# Patient Record
Sex: Female | Born: 1990 | Hispanic: Yes | Marital: Single | Smoking: Never smoker
Health system: Southern US, Community
[De-identification: ages and names within clinical notes are randomized; demographics above are authoritative.]

---

## 2016-08-04 ENCOUNTER — Ambulatory Visit (INDEPENDENT_AMBULATORY_CARE_PROVIDER_SITE_OTHER): Payer: Self-pay | Admitting: Family Medicine

## 2016-08-04 VITALS — BP 100/68 | HR 57 | Temp 98.3°F | Resp 18 | Ht 60.0 in | Wt 120.0 lb

## 2016-08-04 DIAGNOSIS — G4489 Other headache syndrome: Secondary | ICD-10-CM

## 2016-08-04 DIAGNOSIS — R112 Nausea with vomiting, unspecified: Secondary | ICD-10-CM

## 2016-08-04 LAB — POCT URINALYSIS DIP (MANUAL ENTRY)
Bilirubin, UA: NEGATIVE
Glucose, UA: NEGATIVE
Ketones, POC UA: NEGATIVE
LEUKOCYTES UA: NEGATIVE
NITRITE UA: NEGATIVE
PH UA: 6.5
PROTEIN UA: NEGATIVE
Spec Grav, UA: 1.01
Urobilinogen, UA: 0.2

## 2016-08-04 LAB — POCT URINE PREGNANCY: PREG TEST UR: NEGATIVE

## 2016-08-04 MED ORDER — NAPROXEN 500 MG PO TABS: 500.0000 mg | ORAL_TABLET | Freq: Two times a day (BID) | ORAL | 0 refills | Status: DC

## 2016-08-04 MED ORDER — PROMETHAZINE HCL 12.5 MG PO TABS: 12.5000 mg | ORAL_TABLET | Freq: Three times a day (TID) | ORAL | 0 refills | Status: DC | PRN

## 2016-08-04 MED ORDER — CYCLOBENZAPRINE HCL 10 MG PO TABS: 10.0000 mg | ORAL_TABLET | Freq: Three times a day (TID) | ORAL | 0 refills | Status: DC | PRN

## 2016-08-04 MED ORDER — TOPIRAMATE 25 MG PO TABS: 50.0000 mg | ORAL_TABLET | Freq: Two times a day (BID) | ORAL | 1 refills | Status: DC

## 2016-08-04 NOTE — Progress Notes (Signed)
[facility]

## 2016-08-04 NOTE — Progress Notes (Addendum)
Patient ID: Sabrina Sims, female    DOB: 21-Feb-1991, 26 y.o.   MRN: [id number]  PCP: No primary care provider on file.  Chief Complaint  Patient presents with  . Emesis  . Headache  . Nausea    Subjective:  HPI [id number] Sabrina Sims Interpreter Service  26 year old female presents for evaluation of nausea, vomiting, and headache. LMPD-07/28/2016. Reports a 2 year hx of headaches that she reports has increased in frequency over the last few months. Most recent headache occurred 2 days ago. Characterizes headache as a throbbing pain, most pronounced behind the eye, most often is unilateral, with most recent headaches occurring within the right ocular region. Headaches are accompanies by nausea, vomiting, facial numbness, and blurry vision. Once headache resolves, symptoms subside. Nausea with vomiting causes improvement of headache. Headache is often improved with vomiting, sleep, and Excedrin. Average duration of headaches ranges from 7-8 hours. Reports during menstrual cycles notices pronounced body aches that resolve after cycle is completed. Reports regular food and fluid intake.   Social History   Social History  . Marital status: Single    Spouse name: N/A  . Number of children: N/A  . Years of education: N/A   Occupational History  . Not on file.   Social History Main Topics  . Smoking status: Never Smoker  . Smokeless tobacco: Never Used  . Alcohol use No  . Drug use: No  . Sexual activity: Not on file   Other Topics Concern  . Not on file   Social History Narrative  . No narrative on file   Review of Systems See HPI  Prior to Admission medications   Medication Sig Start Date End Date Taking? Authorizing Provider  ferrous sulfate 325 (65 FE) MG EC tablet Take 325 mg by mouth 3 (three) times daily with meals.   Yes Historical Provider, MD  Past Medical, Surgical Family and Social History reviewed and updated.  Objective:   Today's Vitals   08/04/16 1010  BP:  100/68  Pulse: (!) 57  Resp: 18  Temp: 98.3 F (36.8 C)  TempSrc: Oral  SpO2: 100%  Weight: 120 lb (54.4 kg)  Height: 5' (1.524 m)    Wt Readings from Last 3 Encounters:  08/04/16 120 lb (54.4 kg)   Physical Exam  Constitutional: She is oriented to person, place, and time. She appears well-developed and well-nourished.  HENT:  Head: Normocephalic and atraumatic.  Right Ear: External ear normal.  Left Ear: External ear normal.  Eyes: Conjunctivae and EOM are normal.  Neck: Normal range of motion. Neck supple.  Cardiovascular: Normal rate, regular rhythm, normal heart sounds and intact distal pulses.   Pulmonary/Chest: Effort normal and breath sounds normal.  Musculoskeletal: Normal range of motion.  Lymphadenopathy:    She has no cervical adenopathy.  Neurological: She is alert and oriented to person, place, and time. No cranial nerve deficit. Coordination normal.  Negative for nystagmus Noted slight sluggish response to light in right pupil. Re-examined and pupils were more appropriately symmetrical in response to light  Skin: Skin is warm and dry.  Psychiatric: She has a normal mood and affect. Her behavior is normal. Judgment and thought content normal.      Assessment & Plan:  1. Migraine headache, with ocular involvement. Initiating preventive tx and patient referred to opthalmology for vision evaluation. -Start Topamax 25 mg once daily for one week then increase to 50 mg once daily for headache prevention - Ambulatory referral to Ophthalmology  2. Nausea and vomiting, intractability of vomiting not specified, unspecified vomiting type - Negative urine pregnancy test. -promethazine 12.5 mg every 8 hours as needed for nausea.  Godfrey Pick. Tiburcio Pea, MSN, FNP-C Primary Care at [facility] [phone]

## 2016-08-04 NOTE — Patient Instructions (Addendum)
Inicie Topamax 25 mg una vez al da International Paperdurante una semana. A continuacin, inicie Topamax 50 mg al da para prevenir el dolor de cabeza. Para dolores de cabeza agudos, tome cyclobenzaprine 10 mg hasta 3 veces al da con Naproxen 500 mg con alimentos. Tome prometazina 12.5 mg cada 8 horas segn sea necesario para las nuseas. Te he recomendado ver una oftalmologa para una evaluacin ptica.   Start Topamax 25 mg once daily for one week.  Then start Topamax 50 mg daily for headache prevention.  For acute headaches, take cyclobenzaprine 10 mg up to 3 times daily with Naproxen 500 mg with food.  Take promethazine 12.5 mg every 8 hours as needed for nausea.  I have referred you to see opthalmology for an optical evaluation.   IF you received an x-ray today, you will receive an invoice from [facility]. Please contact [facility] at [phone] with questions or concerns regarding your invoice.   IF you received labwork today, you will receive an invoice from [facility]. Please contact [facility] at [phone] with questions or concerns regarding your invoice.   Our billing staff will not be able to assist you with questions regarding bills from these companies.  You will be contacted with the lab results as soon as they are available. The fastest way to get your results is to activate your My Chart account. Instructions are located on the last page of this paperwork. If you have not heard from us regarding the results in 2 weeks, please contact this office.     Cefalea migraosa recurrente. (Recurrent Migraine Headache) Una cefalea migraosa es un dolor muy intenso y punzante en uno o ambos lados de la cabeza. Las migraas recurrentes aparecen Neomia Dearuna y Laverda Pageotra vez. Hable con su mdico United Stationerssobre los factores que pueden causar Animal nutritionist(desencadenar) las Soil scientistcefaleas migraosas. CUIDADOS EN EL Nucor CorporationHOGAR  Tome solo los United Parcelmedicamentos segn le haya indicado el mdico.  Cuando tenga la migraa, acustese en  un cuarto oscuro y tranquilo  Lleve un registro diario para averiguar si hay ciertas cosas que le provocan la cefalea migraosa. Por ejemplo, escriba:  Lo que usted come y bebe.  Cunto tiempo duerme.  Algn cambio en su dieta o en los medicamentos.  Beba menos alcohol.  Si fuma, deje de hacerlo.  Duerma lo suficiente.  Disminuya todo tipo de estrs de la vida diaria.  Mantenga las luces tenues si le Goodrich Corporationmolestan las luces brillantes o hacen que la Bishopmigraa empeore. SOLICITE AYUDA SI:  Los medicamentos no Air traffic controllerle calman la migraa.  El dolor regresa.  Tiene fiebre. SOLICITE AYUDA DE INMEDIATO SI:  La migraa empeora.  Presenta rigidez en el cuello.  Tiene dificultad para ver.  Sus msculos estn dbiles, o pierde el control muscular.  Pierde el equilibrio o tiene problemas para Advertising account plannercaminar.  Siente que se desvanece (debilidad) o se desmaya.  Tiene malos sntomas que son diferentes a los primeros sntomas. ASEGRESE DE QUE:  Comprende estas instrucciones.  Controlar su afeccin.  Recibir ayuda de inmediato si no mejora o si empeora. Esta informacin no tiene Theme park managercomo fin reemplazar el consejo del mdico. Asegrese de hacerle al mdico cualquier pregunta que tenga. Document Released: 08/31/2011 Document Revised: 06/13/2013 Document Reviewed: 02/13/2013 Elsevier Interactive Patient Education  2017 [employer]

## 2016-08-05 LAB — URINE CULTURE: Organism ID, Bacteria: NO GROWTH

## 2018-01-25 ENCOUNTER — Other Ambulatory Visit: Payer: Self-pay

## 2018-01-25 ENCOUNTER — Encounter: Payer: Self-pay | Admitting: Family Medicine

## 2018-01-25 ENCOUNTER — Ambulatory Visit (INDEPENDENT_AMBULATORY_CARE_PROVIDER_SITE_OTHER): Payer: Self-pay | Admitting: Family Medicine

## 2018-01-25 VITALS — BP 102/60 | HR 64 | Temp 98.6°F | Ht 60.55 in | Wt 122.6 lb

## 2018-01-25 DIAGNOSIS — R1011 Right upper quadrant pain: Secondary | ICD-10-CM

## 2018-01-25 DIAGNOSIS — J300 Vasomotor rhinitis: Secondary | ICD-10-CM

## 2018-01-25 LAB — COMPREHENSIVE METABOLIC PANEL
ALT: 16 IU/L (ref 0–32)
AST: 22 IU/L (ref 0–40)
Albumin/Globulin Ratio: 1.7 (ref 1.2–2.2)
Albumin: 4.8 g/dL (ref 3.5–5.5)
Alkaline Phosphatase: 63 IU/L (ref 39–117)
BUN/Creatinine Ratio: 14 (ref 9–23)
BUN: 8 mg/dL (ref 6–20)
Bilirubin Total: 0.3 mg/dL (ref 0.0–1.2)
CO2: 26 mmol/L (ref 20–29)
Calcium: 9.6 mg/dL (ref 8.7–10.2)
Chloride: 99 mmol/L (ref 96–106)
Creatinine, Ser: 0.58 mg/dL (ref 0.57–1.00)
GFR calc Af Amer: 144 mL/min/{1.73_m2} (ref >59)
GFR calc non Af Amer: 125 mL/min/{1.73_m2} (ref >59)
Globulin, Total: 2.9 g/dL (ref 1.5–4.5)
Glucose: 91 mg/dL (ref 65–99)
Potassium: 5.1 mmol/L (ref 3.5–5.2)
Sodium: 138 mmol/L (ref 134–144)
Total Protein: 7.7 g/dL (ref 6.0–8.5)

## 2018-01-25 LAB — CBC
Hematocrit: 40.5 % (ref 34.0–46.6)
Hemoglobin: 13.6 g/dL (ref 11.1–15.9)
MCH: 30.8 pg (ref 26.6–33.0)
MCHC: 33.6 g/dL (ref 31.5–35.7)
MCV: 92 fL (ref 79–97)
Platelets: 261 10*3/uL (ref 150–450)
RBC: 4.41 x10E6/uL (ref 3.77–5.28)
RDW: 12.8 % (ref 12.3–15.4)
WBC: 5.3 10*3/uL (ref 3.4–10.8)

## 2018-01-25 MED ORDER — FLUTICASONE PROPIONATE 50 MCG/ACT NA SUSP: 1.0000 | Freq: Two times a day (BID) | NASAL | 6 refills | Status: DC

## 2018-01-25 NOTE — Patient Instructions (Addendum)
IF you received an x-ray today, you will receive an invoice from [facility]. Please contact [facility] at [phone] with questions or concerns regarding your invoice.   IF you received labwork today, you will receive an invoice from [facility]. Please contact [facility] at [phone] with questions or concerns regarding your invoice.   Our billing staff will not be able to assist you with questions regarding bills from these companies.  You will be contacted with the lab results as soon as they are available. The fastest way to get your results is to activate your My Chart account. Instructions are located on the last page of this paperwork. If you have not heard from us regarding the results in 2 weeks, please contact this office.     Clico biliar en los adultos (Biliary Colic, Adult) El clico biliar es un dolor intenso que lo produce un pequeo rgano que se encuentra en la parte superior derecha del vientre (vescula). La vescula almacena un fluido digestivo que se produce en el hgado (bilis) y que permite que el cuerpo degrade la grasa. La bilis y otras enzimas digestivas son transportadas desde el hgado hasta el intestino delgado a travs de estructuras con forma de tubos (conductillos biliares). La vescula y los conductillos biliares forman las vas biliares. A veces se forman depsitos duros de fluidos digestivos en la vescula (clculos biliares) que bloquean el flujo de bilis hacia esta y as se produce un clico biliar. Esta afeccin tambin se conoce como ataque de la vescula biliar. Los clculos biliares pueden ser tan pequeos como un grano de arena o tan grandes como una pelota de golf. Es posible que haya un solo clculo biliar en la vescula o que haya muchos. CAUSAS A menudo la causa del clico biliar son los clculos en la vescula biliar. Con menor frecuencia, un tumor puede bloquear el flujo de bilis desde la vescula y causar un clico  biliar. FACTORES DE RIESGO Es ms probable que esta afeccin se manifieste en:  Mujeres.  Personas con ascendencia hispnica.  Personas con antecedentes familiares de clculos biliares.  Personas obesas.  Personas que [place] and Herz[place]ovinaaumentan o bajan de peso rpidamente.  Personas que tienen una dieta hipercalrica, baja en fibra y rica en carbohidratos refinados, como el pan blanco y el arroz blanco.  Personas que tienen una enfermedad intestinal que afecta la absorcin de nutrientes, como la enfermedad de Crohn.  Personas con enfermedades metablicas, como el sndrome metablico o la diabetes. SNTOMAS El dolor intenso en la zona superior derecha del estmago es el principal sntoma del clico biliar. Es posible que sienta este dolor debajo del pecho pero encima de la cadera. Este dolor ocurre con frecuencia por la noche o despus de comer una comida muy grasosa. El dolor puede empeorar por hasta una hora y durar hasta 12horas. En la International Business Machinesmayora de los casos, el dolor se desvanece (desaparece) en un par de horas. Entre los otros sntomas de esta afeccin, se incluye lo siguiente:  Nuseas y vmitos.  Dolor debajo del hombro derecho. DIAGNSTICO Esta afeccin se diagnostica en funcin de los sntomas, los antecedentes mdicos y un examen fsico. Tambin pueden hacerle exmenes que incluyen lo siguiente:  Anlisis de sangre para descartar una infeccin o inflamacin de los conductillos biliares, de la vescula, del pncreas o del hgado.  Estudios de diagnstico por imgenes, por ejemplo: ? Ecografa. ? Tomografa computarizada (TC). ? Resonancia magntica (RM). En ciertos casos, es posible que le realicen un estudio de diagnstico  por imgenes con una pequea cantidad de material radioactivo (medicina nuclear) para Pharmacist, hospital diagnstico. TRATAMIENTO El tratamiento de esta afeccin puede incluir medicamentos para Acupuncturist dolor o las nuseas. Si tiene clculos biliares que le producen clicos  biliares, es posible que necesite una ciruga para extraer la vescula (colecistectoma). Los clculos biliares tambin se pueden disolver gradualmente con medicamentos. Pueden transcurrir meses o aos antes de que desaparezcan totalmente. INSTRUCCIONES PARA EL CUIDADO EN EL HOGAR  Tome los medicamentos de venta libre y los recetados solamente como se lo haya indicado el mdico.  Beba suficiente lquido para Pharmacologist la orina clara o de color amarillo plido.  Siga las indicaciones del mdico respecto de las restricciones para las comidas o las bebidas. Estas pueden incluir evitar: ? Alimentos grasos y fritos. ? Cualquier comida que intensifique Chief Technology Officer. ? Comer en exceso. ? Ingerir una comida abundante despus de no haber comido por [employer] .  Concurra a todas las visitas de control como se lo haya indicado el mdico. Esto es importante. PREVENCIN Los pasos a seguir para Nutritional therapist afeccin incluyen:  Hydrographic surveyor adecuado.  Practicar actividad fsica con regularidad.  Tener una dieta sana, alta en fibras y baja en grasas.  Limitar la cantidad de azcar y carbohidratos refinados que se consumen, como dulces, harinas blancas y arroz blanco. SOLICITE ATENCIN MDICA SI:  El dolor le dura ms de 5horas.  Vomita.  Siente escalofros o tiene fiebre.  El dolor [place]. SOLICITE ATENCIN MDICA DE INMEDIATO SI:  Tiene un color amarillo en la piel o en la zona blanca del ojo (ictericia).  Tiene la [employer] t y las heces muy claras.  Se siente mareado o se desmaya. Esta informacin no tiene Theme park manager el consejo del mdico. Asegrese de hacerle al mdico cualquier pregunta que tenga. Document Released: 09/15/2007 Document Revised: 10/23/2014 Document Reviewed: 12/23/2015 Elsevier Interactive Patient Education  2018 Arvin[employer].

## 2018-01-25 NOTE — Progress Notes (Signed)
8/6/201910:13 AM  Edison SimonLucia Ventura Machado 03-01-1991, 27 y.o. female [id number]  Chief Complaint  Patient presents with  . Nasal Congestion    since yesterday has had runny nose, took an allergy medication with no resolve    HPI:   Patient is a 27 y.o. female who presents today with couple of concerns  1. Intermittent recurring episodes of awakening at night with constant runny nose, clear. Sometimes sneezing. Took benadryl and did not help. Denies h/o allergies. Reports similar sx in the winter. She reports occ nasal congestion and pressure in ears. Denise itching.denies any ear pain, sinus pain, fever, chills, sore throat, cough or Sob  2. 2 months of ruq pain that radiates to her right upper back after she eats, worse if she eats "heavy". None for the past 2 weeks or so. Pain intense enough that it would maker her splint, feel weak and had to lie down. No fever, chills, nausea or vomiting.   Fall Risk  01/25/2018 08/04/2016  Falls in the past year? No No     [facility] 01/25/2018 08/04/2016  Decreased Interest 0 0  Down, Depressed, Hopeless 0 0  PHQ - 2 Score 0 0    No Known Allergies  Prior to Admission medications   Not on File    History reviewed. No pertinent past medical history.  History reviewed. No pertinent surgical history.  Social History   Tobacco Use  . Smoking status: Never Smoker  . Smokeless tobacco: Never Used  Substance Use Topics  . Alcohol use: No    Family History  Problem Relation Age of Onset  . Healthy Mother   . Healthy Father   . Healthy Sister     ROS Per hpi  OBJECTIVE:  Blood pressure 102/60, pulse 64, temperature 98.6 F (37 C), temperature source Oral, height 5' 0.55" (1.538 m), weight 122 lb 9.6 oz (55.6 kg), last menstrual period 01/07/2018, SpO2 99 %. Body mass index is 23.51 kg/m.   Physical Exam  Constitutional: She is oriented to person, place, and time. She appears well-developed and well-nourished.    HENT:  Head: Normocephalic and atraumatic.  Right Ear: Hearing, tympanic membrane, external ear and ear canal normal.  Left Ear: Hearing, tympanic membrane, external ear and ear canal normal.  Mouth/Throat: Oropharynx is clear and moist.  Eyes: Pupils are equal, round, and reactive to light. EOM are normal.  Neck: Neck supple.  Cardiovascular: Normal rate, regular rhythm and normal heart sounds. Exam reveals no gallop and no friction rub.  No murmur heard. Pulmonary/Chest: Effort normal and breath sounds normal. She has no wheezes. She has no rales.  Abdominal: Soft. Bowel sounds are normal. There is no hepatosplenomegaly. There is tenderness in the right upper quadrant. There is no rebound, no guarding and negative Murphy's sign.  Lymphadenopathy:    She has no cervical adenopathy.  Neurological: She is alert and oriented to person, place, and time.  Skin: Skin is warm and dry.  Nursing note and vitals reviewed.    ASSESSMENT and PLAN  1. Acute vasomotor rhinitis Discussed use of flonase or similar. Supportive measures  2. RUQ pain Concerning for biliary colic. Dietary modifications reviewed. Strict ER precautions given - CBC - Comprehensive metabolic panel - US Abdomen Limited RUQ; Future  Other orders - fluticasone (FLONASE) 50 MCG/ACT nasal spray; Place 1 spray into both nostrils 2 (two) times daily.  Return if symptoms worsen or fail to improve.    Myles LippsIrma M Santiago, MD  Primary Care at [place], [place] [address] Ph.  [phone] Fax [phone]

## 2018-02-15 ENCOUNTER — Ambulatory Visit
Admission: RE | Admit: 2018-02-15 | Discharge: 2018-02-15 | Disposition: A | Discharge status: Home / Self Care | Payer: Self-pay | Source: Ambulatory Visit | Attending: Family Medicine | Admitting: Family Medicine

## 2018-02-15 DIAGNOSIS — R1011 Right upper quadrant pain: Secondary | ICD-10-CM

## 2018-03-02 ENCOUNTER — Telehealth: Payer: Self-pay | Admitting: Family Medicine

## 2018-03-02 NOTE — Telephone Encounter (Signed)
Copied from CRM [id number]. Topic: Quick Communication - Other Results >> Mar 02, 2018  1:19 PM Crist Infante wrote: Pt's friend Irving Shows calling for results of the [place].  Pt does not speak english, and he is on the [facility].

## 2018-03-03 NOTE — Telephone Encounter (Signed)
Results given to friend Voiced understanding

## 2018-03-10 ENCOUNTER — Encounter: Payer: Self-pay | Admitting: *Deleted

## 2019-05-16 IMAGING — US US ABDOMEN LIMITED
1 series · 14 of 25 positions shown · non-contrast
Comparison: None.

CLINICAL DATA: Intermittent right upper quadrant pain
postprandially. The discomfort radiates to the back.

EXAM:
ULTRASOUND ABDOMEN LIMITED RIGHT UPPER QUADRANT

[Series 1: us abdomen limited · 0.17mm/px · 14 of 50 slices shown]
[im 1/50]
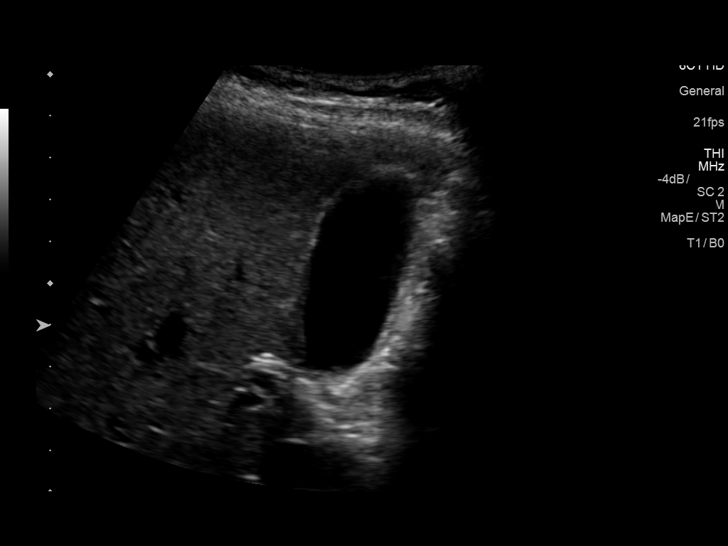
[im 5/50]
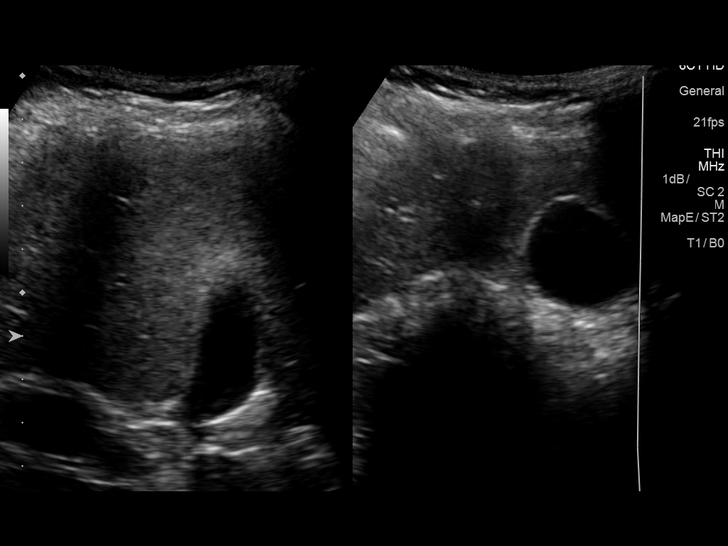
[im 9/50]
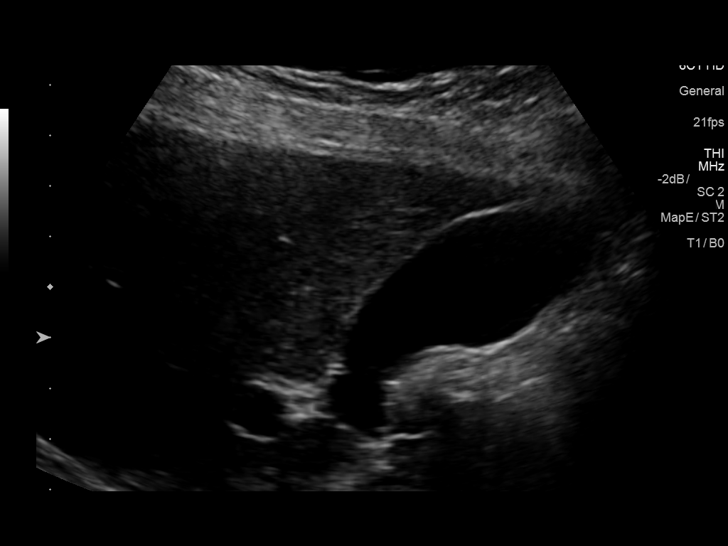
[im 13/50]
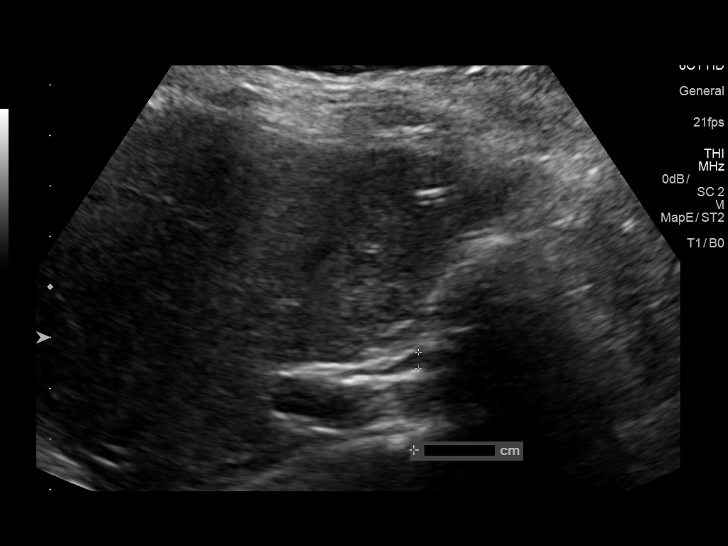
[im 17/50]
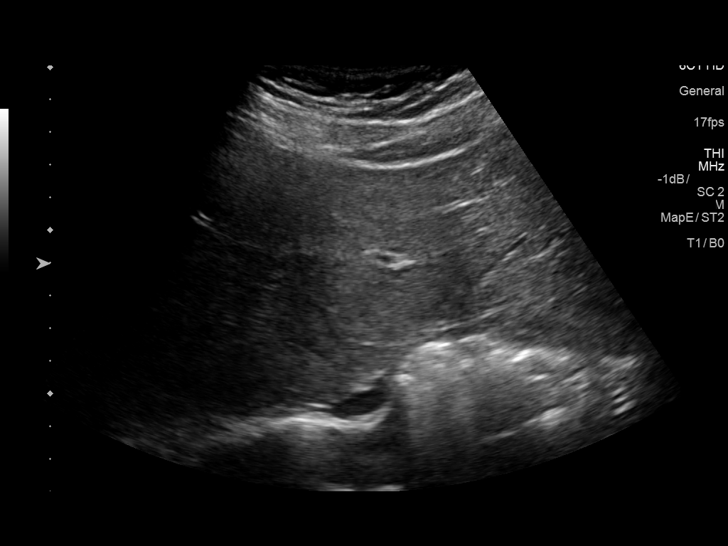
[im 19/50]
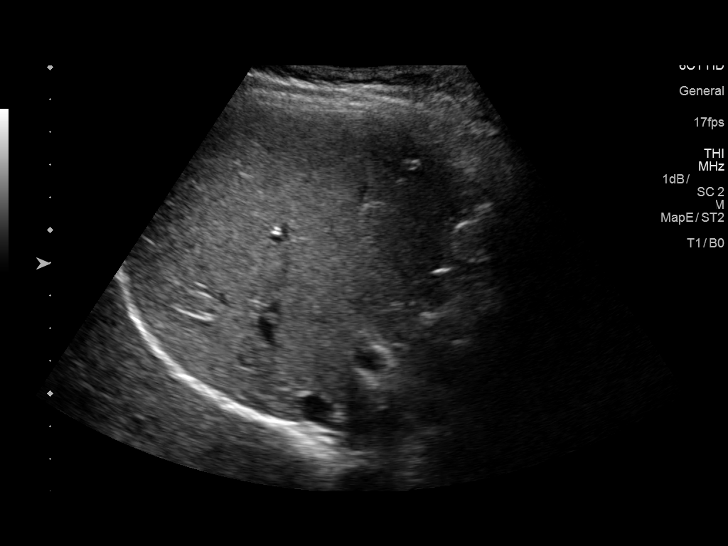
[im 23/50]
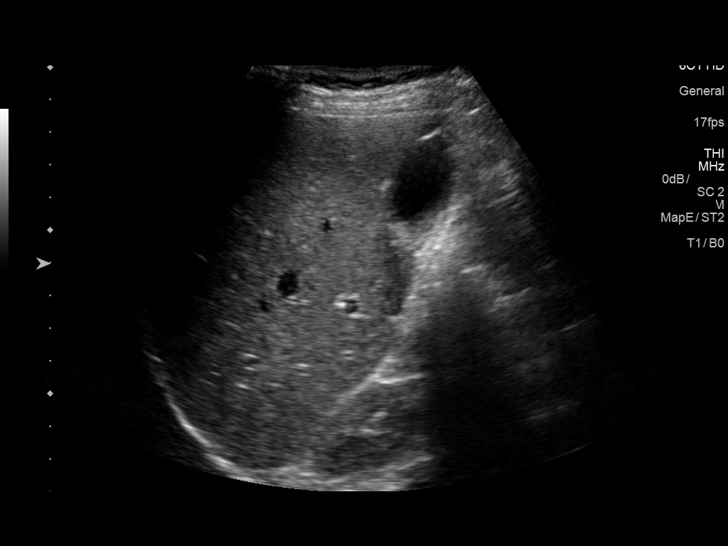
[im 27/50]
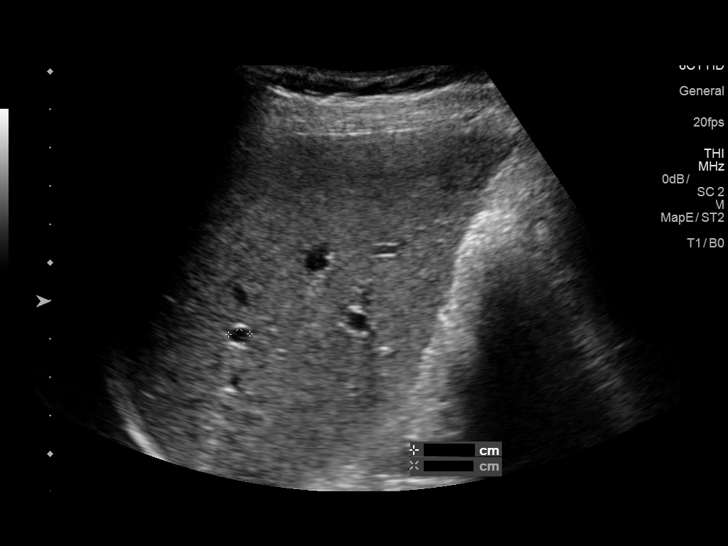
[im 31/50]
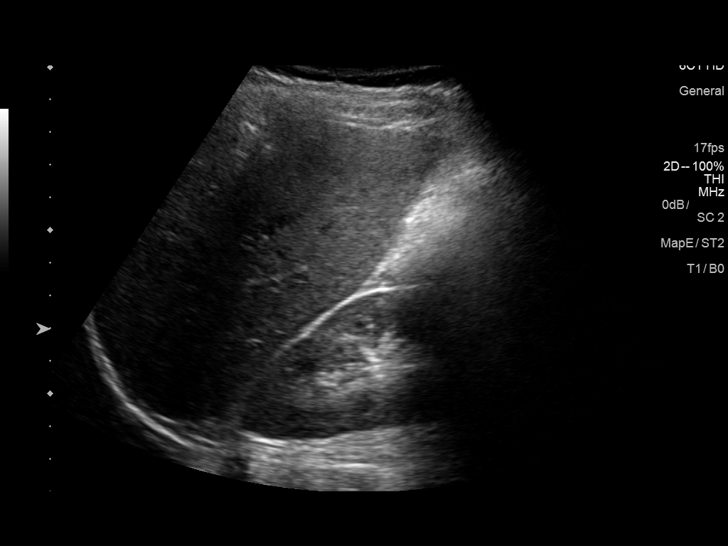
[im 33/50]
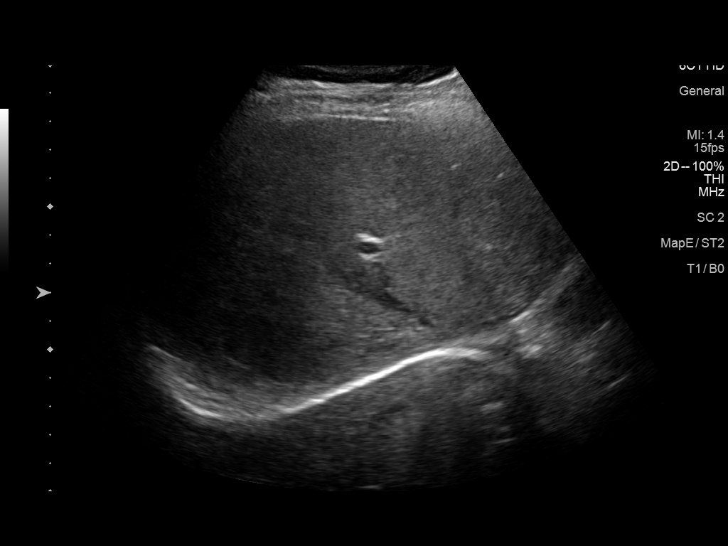
[im 37/50]
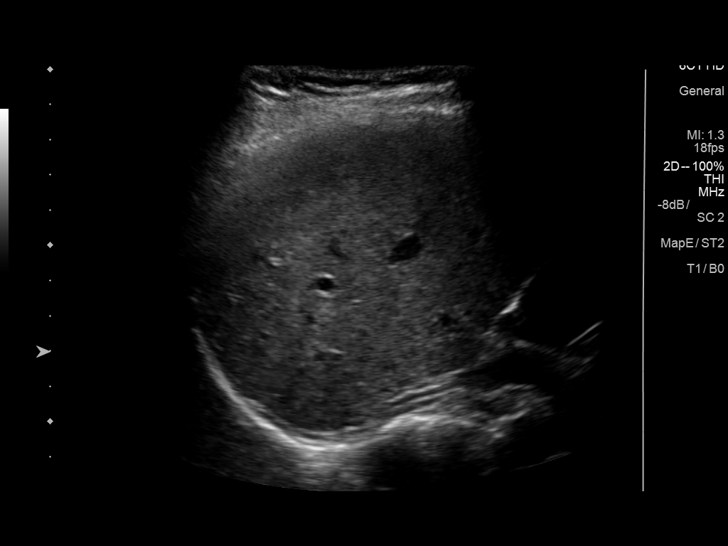
[im 41/50]
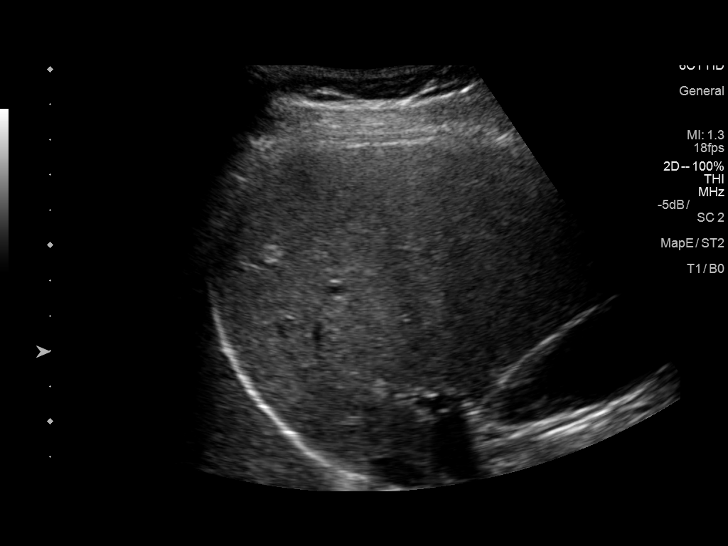
[im 45/50]
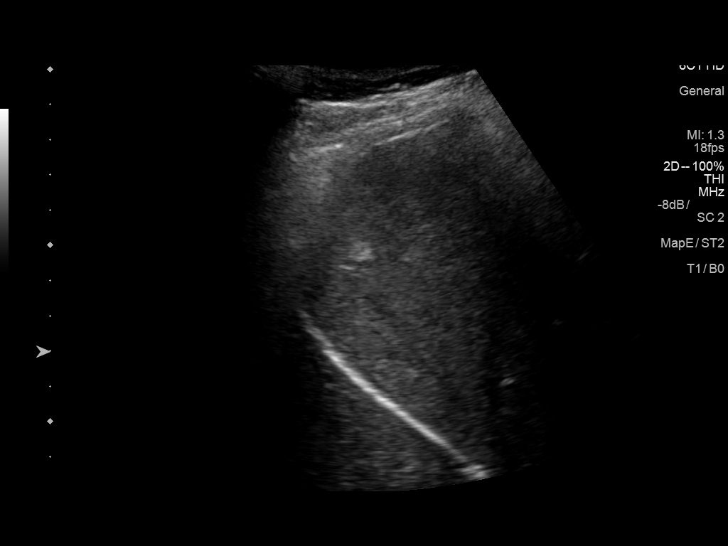
[im 50/50]
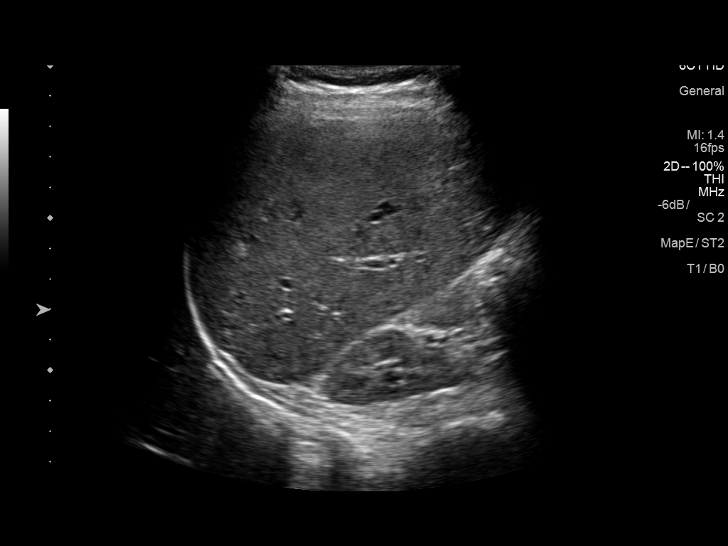

[14 of 25 positions shown; findings below may reference images not displayed]

FINDINGS: Gallbladder:

No gallstones or wall thickening visualized. No sonographic Murphy
sign noted by sonographer.

Common bile duct:

Diameter: 3.2 mm

Liver:

The hepatic echotexture is normal. There is a cyst measuring 6 x 5 x
5 mm in the right hepatic lobe. There is a solid-appearing
hyperechoic focus in the right hepatic lobe measuring 7 x 5 x 7 mm.
There is no intrahepatic ductal dilation. Portal vein is patent on
color Doppler imaging with normal direction of blood flow towards
the liver.
IMPRESSION: No gallstones or sonographic evidence of acute cholecystitis. If
there are strong clinical concerns of chronic gallbladder
dysfunction, a nuclear medicine hepatobiliary scan with gallbladder
ejection fraction determination may be useful.

Probable subcentimeter hemangioma in the right hepatic lobe. Simple
appearing subcentimeter cyst in the right hepatic lobe.

## 2019-07-11 ENCOUNTER — Ambulatory Visit (INDEPENDENT_AMBULATORY_CARE_PROVIDER_SITE_OTHER): Payer: Self-pay | Admitting: Emergency Medicine

## 2019-07-11 ENCOUNTER — Encounter: Payer: Self-pay | Admitting: Emergency Medicine

## 2019-07-11 ENCOUNTER — Other Ambulatory Visit: Payer: Self-pay

## 2019-07-11 VITALS — BP 106/70 | HR 73 | Temp 98.0°F | Resp 16 | Ht 63.5 in | Wt 123.0 lb

## 2019-07-11 DIAGNOSIS — G43109 Migraine with aura, not intractable, without status migrainosus: Secondary | ICD-10-CM

## 2019-07-11 DIAGNOSIS — Z20822 Contact with and (suspected) exposure to covid-19: Secondary | ICD-10-CM

## 2019-07-11 MED ORDER — SUMATRIPTAN SUCCINATE 50 MG PO TABS: ORAL_TABLET | ORAL | 3 refills | Status: DC

## 2019-07-11 NOTE — Progress Notes (Signed)
Sabrina Sims 29 y.o.   Chief Complaint  Patient presents with  . Headache    per patient that covers the RIGHT eye, since Sunday     HISTORY OF PRESENT ILLNESS: This is a 29 y.o. female complaining of migraine headache.  Has a history of migraine headaches for the last 10 years.  Describes them as pulsatile, unilateral, debilitating, lasting anywhere from 10 to 12 hours, associated with nausea and vomiting.  Has been getting 2 or 3/month.  Denies head injuries.  Denies syncope.  Sometimes preceded by photo or phonophobia.  Sometimes she gets visual symptoms.  No other associated symptoms.  Rest and vomiting makes them better. Saw ophthalmologist 1 year ago and told visual symptoms were migraine related. Brother-in-law recently passed from Covid pneumonia.  Requesting blood test due to exposure.  HPI   Prior to Admission medications   Medication Sig Start Date End Date Taking? Authorizing Provider  fluticasone (FLONASE) 50 MCG/ACT nasal spray Place 1 spray into both nostrils 2 (two) times daily. Patient not taking: Reported on 07/11/2019 01/25/18   Myles Lipps, MD    Not on File  There are no problems to display for this patient.   History reviewed. No pertinent past medical history.  History reviewed. No pertinent surgical history.  Social History   Socioeconomic History  . Marital status: Single    Spouse name: Not on file  . Number of children: Not on file  . Years of education: Not on file  . Highest education level: Not on file  Occupational History  . Not on file  Tobacco Use  . Smoking status: Never Smoker  . Smokeless tobacco: Never Used  Substance and Sexual Activity  . Alcohol use: No  . Drug use: No  . Sexual activity: Not on file  Other Topics Concern  . Not on file  Social History Narrative  . Not on file   Social Determinants of Health   Financial Resource Strain:   . Difficulty of Paying Living Expenses: Not on file  Food Insecurity:    . Worried About Programme researcher, broadcasting/film/video in the Last Year: Not on file  . Ran Out of Food in the Last Year: Not on file  Transportation Needs:   . Lack of Transportation (Medical): Not on file  . Lack of Transportation (Non-Medical): Not on file  Physical Activity:   . Days of Exercise per Week: Not on file  . Minutes of Exercise per Session: Not on file  Stress:   . Feeling of Stress : Not on file  Social Connections:   . Frequency of Communication with Friends and Family: Not on file  . Frequency of Social Gatherings with Friends and Family: Not on file  . Attends Religious Services: Not on file  . Active Member of Clubs or Organizations: Not on file  . Attends Banker Meetings: Not on file  . Marital Status: Not on file  Intimate Partner Violence:   . Fear of Current or Ex-Partner: Not on file  . Emotionally Abused: Not on file  . Physically Abused: Not on file  . Sexually Abused: Not on file    Family History  Problem Relation Age of Onset  . Healthy Mother   . Healthy Father   . Healthy Sister      Review of Systems  Constitutional: Negative.  Negative for chills, fever and weight loss.  HENT: Negative.  Negative for congestion, ear pain, hearing loss and sore  throat.   Eyes: Positive for photophobia.  Respiratory: Negative.  Negative for cough and shortness of breath.   Cardiovascular: Negative.  Negative for chest pain and palpitations.  Gastrointestinal: Positive for nausea and vomiting. Negative for abdominal pain, blood in stool and diarrhea.  Genitourinary: Negative.   Musculoskeletal: Negative.  Negative for back pain, myalgias and neck pain.  Skin: Negative.  Negative for rash.  Neurological: Positive for headaches. Negative for dizziness, sensory change, speech change, focal weakness and loss of consciousness.  All other systems reviewed and are negative.  Today's Vitals   07/11/19 1115  BP: 106/70  Pulse: 73  Resp: 16  Temp: 98 F (36.7 C)    TempSrc: Temporal  SpO2: 98%  Weight: 123 lb (55.8 kg)  Height: 5' 3.5" (1.613 m)   Body mass index is 21.45 kg/m.   Physical Exam Vitals reviewed.  Constitutional:      Appearance: She is well-developed.  HENT:     Head: Normocephalic.     Right Ear: Tympanic membrane, ear canal and external ear normal.     Left Ear: Tympanic membrane, ear canal and external ear normal.     Mouth/Throat:     Mouth: Mucous membranes are moist.     Pharynx: Oropharynx is clear.  Eyes:     Extraocular Movements: Extraocular movements intact.     Conjunctiva/sclera: Conjunctivae normal.     Pupils: Pupils are equal, round, and reactive to light.  Cardiovascular:     Rate and Rhythm: Normal rate and regular rhythm.     Pulses: Normal pulses.     Heart sounds: Normal heart sounds.  Pulmonary:     Effort: Pulmonary effort is normal.     Breath sounds: Normal breath sounds.  Abdominal:     General: Bowel sounds are normal. There is no distension.     Palpations: Abdomen is soft. There is no mass.     Tenderness: There is no abdominal tenderness.  Musculoskeletal:        General: Normal range of motion.     Cervical back: Normal range of motion and neck supple.  Skin:    General: Skin is warm and dry.     Capillary Refill: Capillary refill takes less than 2 seconds.  Neurological:     General: No focal deficit present.     Mental Status: She is alert and oriented to person, place, and time.  Psychiatric:        Mood and Affect: Mood normal.        Behavior: Behavior normal.      ASSESSMENT & PLAN: Anesia was seen today for headache.  Diagnoses and all orders for this visit:  Migraine with aura and without status migrainosus, not intractable -     SUMAtriptan (IMITREX) 50 MG tablet; Take 50 mg at the onset of headache; may repeat every 2 hours but no more than 200 mg/24 hours.  Close exposure to COVID-19 virus -     SAR CoV2 Serology (COVID 19)AB(IGG)IA    Patient Instructions        If you have lab work done today you will be contacted with your lab results within the next 2 weeks.  If you have not heard from [place] then please contact us. The fastest way to get your results is to register for My Chart.   IF you received an x-ray today, you will receive an invoice from [facility]. Please contact [facility] at [phone] with questions or concerns  regarding your invoice.   IF you received labwork today, you will receive an invoice from [place]. Please contact [facility] at [phone] with questions or concerns regarding your invoice.   Our billing staff will not be able to assist you with questions regarding bills from these companies.  You will be contacted with the lab results as soon as they are available. The fastest way to get your results is to activate your My Chart account. Instructions are located on the last page of this paperwork. If you have not heard from [place] regarding the results in 2 weeks, please contact this office.     Cefalea migraosa Migraine Headache Una cefalea migraosa es un dolor muy intenso y punzante en uno o ambos lados de la cabeza. Este tipo de dolor de cabeza tambin puede causar otros sntomas. Puede durar desde 4horas hasta 3das. Hable con su mdico sobre las cosas que pueden causar (desencadenar) esta afeccin. Cules son las causas? Se desconoce la causa exacta de esta afeccin. Esta afeccin puede desencadenarse o ser causada por lo siguiente:  Consumo de alcohol.  Consumo de cigarrillos.  Tomar medicamentos como por ejemplo: ? Medicamentos para Engineer, materials torcico (nitroglicerina). ? Anticonceptivos orales. ? Estrgeno. ? Algunos medicamentos para la presin arterial.  Comer o beber ciertos productos.  Hacer actividad fsica. Otros factores que pueden provocar cefalea migraosa son los siguientes:  Tener el perodo menstrual.  Vanetta Mulders.  Hambre.  Estrs.  No dormir lo  suficiente o dormir demasiado.  Cambios climticos.  Cansancio (fatiga). Qu incrementa el riesgo?  Tener entre 25 y 55aos de edad.  Ser mujer.  Tener antecedentes familiares de cefalea migraosa.  Ser de Engineer, manufacturing.  Tener depresin o ansiedad.  Tener mucho sobrepeso. Cules son los signos o los sntomas?  Un dolor punzante. Este dolor puede [employer] siguientes caractersticas: ? Software engineer en cualquier regin de la cabeza, tanto de un lado como de [place]. ? Puede dificultar las actividades cotidianas. ? Puede empeorar con la actividad fsica. ? Puede empeorar con las luces brillantes o los ruidos fuertes.  Otros sntomas pueden incluir: ? Ganas de vomitar (nuseas). ? Vmitos. ? Mareos. ? Sensibilidad a las luces brillantes, los ruidos fuertes o [employer].  Antes de tener una cefalea migraosa, puede recibir seales de advertencia (aura). Un aura puede incluir: ? Ver luces intermitentes o tener puntos ciegos. ? Ver puntos brillantes, halos o lneas en zigzag. ? Tener una visin en tnel o visin borrosa. ? Sentir entumecimiento u hormigueo. ? Tener dificultad para hablar. ? Tener msculos dbiles.  Algunas personas tienen sntomas despus de una cefalea migraosa (fase posdromal), como los siguientes: ? Cansancio. ? Dificultad para pensar (concentrarse). Cmo se trata?  Tomar medicamentos para: ? Engineer, materials. ? Aliviar la sensacin de Programme researcher, broadcasting/film/video. ? Prevenir las Soil scientist.  El tratamiento tambin puede incluir lo siguiente: ? Tomar sesiones de acupuntura. ? Evitar los alimentos que provocan las cefaleas migraosas. ? Aprender Duaine Dredge de controlar las funciones corporales (biorretroalimentacin). ? Terapia para ayudarlo a Solicitor y lidiar con los pensamientos negativos (terapia cognitivo conductual). Siga estas instrucciones en su casa: Medicamentos  [employer] de venta libre y los recetados solamente como se lo  haya indicado el mdico.  Consulte a su mdico si el medicamento que le recetaron: ? Hace que sea necesario que evite conducir o usar maquinaria pesada. ? Puede causarle dificultad para defecar (estreimiento). Es posible que deba tomar estas medidas para prevenir o tratar los problemas para  defecar:  Beber suficiente lquido para mantener el pis (la orina) de color amarillo plido.  Tomar medicamentos recetados o de [employer].  Comer alimentos ricos en fibra. Entre ellos, frijoles, cereales integrales y frutas y verduras frescas.  Limitar los alimentos con alto contenido de grasa y Location manager. Estos incluyen alimentos fritos o dulces. Estilo de vida  No beba alcohol.  No consuma ningn producto que contenga nicotina o tabaco, como cigarrillos, cigarrillos electrnicos y tabaco de Higher education careers adviser. Si necesita ayuda para dejar de fumar, consulte al mdico.  Duerma como mnimo 8horas todas las noches.  Limite el estrs y [place]. Indicaciones generales      Lleve un registro diario para Neurosurgeon lo que Financial trader. Registre, por ejemplo, lo siguiente: ? Lo que usted come y bebe. ? El tiempo que duerme. ? Algn cambio en lo que come o bebe. ? Algn cambio en sus medicamentos.  Si tiene una cefalea migraosa: ? Evite los factores que [employer] sntomas, como las luces brillantes. ? Resulta til acostarse en una habitacin oscura y silenciosa. ? No conduzca vehculos ni opere maquinaria pesada. ? Pregntele al mdico qu actividades son seguras para usted.  Concurra a todas las visitas de seguimiento como se lo haya indicado el mdico. Esto es importante. Comunquese con un mdico si:  Tiene una cefalea migraosa que es diferente o peor que otras que ha tenido.  Tiene ms de [address] de cefalea por mes. Solicite ayuda inmediatamente si:  La cefalea migraosa [employer].  La cefalea migraosa dura ms de 72 horas.  Tiene fiebre.  Presenta rigidez  en el cuello.  Tiene dificultad para ver.  Siente debilidad en los msculos o que no puede controlarlos.  Comienza a perder el equilibrio continuamente.  Comienza a tener dificultad para caminar.  Pierde el conocimiento (se desmaya).  Tiene una convulsin. Resumen  [place] cefalea migraosa es un dolor muy intenso y punzante en uno o ambos lados de la cabeza. Estos dolores de [place] tambin pueden causar otros sntomas.  Esta afeccin puede tratarse con medicamentos y cambios en el estilo de vida.  Lleve un registro diario para Neurosurgeon lo que Financial trader.  Comunquese con un mdico si tiene una cefalea migraosa que es diferente o peor que otras que ha tenido.  Comunquese con el mdico si tiene ms de 15 das de cefalea en un mes. Esta informacin no tiene Marine scientist el consejo del mdico. Asegrese de hacerle al mdico cualquier pregunta que tenga. Document Revised: 08/19/2018 Document Reviewed: 08/19/2018 Elsevier Patient Education  2020 Elsevier Inc.      Agustina Caroli, MD [facility]

## 2019-07-11 NOTE — Patient Instructions (Addendum)
If you have lab work done today you will be contacted with your lab results within the next 2 weeks.  If you have not heard from [place] then please contact us. The fastest way to get your results is to register for My Chart.   IF you received an x-ray today, you will receive an invoice from [facility]. Please contact [facility] at [phone] with questions or concerns regarding your invoice.   IF you received labwork today, you will receive an invoice from [place]. Please contact [facility] at [phone] with questions or concerns regarding your invoice.   Our billing staff will not be able to assist you with questions regarding bills from these companies.  You will be contacted with the lab results as soon as they are available. The fastest way to get your results is to activate your My Chart account. Instructions are located on the last page of this paperwork. If you have not heard from [place] regarding the results in 2 weeks, please contact this office.     Cefalea migraosa Migraine Headache Una cefalea migraosa es un dolor muy intenso y punzante en uno o ambos lados de la cabeza. Este tipo de dolor de cabeza tambin puede causar otros sntomas. Puede durar desde 4horas hasta 3das. Hable con su mdico sobre las cosas que pueden causar (desencadenar) esta afeccin. Cules son las causas? Se desconoce la causa exacta de esta afeccin. Esta afeccin puede desencadenarse o ser causada por lo siguiente:  Consumo de alcohol.  Consumo de cigarrillos.  Tomar medicamentos como por ejemplo: ? Medicamentos para Best boy torcico (nitroglicerina). ? Anticonceptivos orales. ? Estrgeno. ? Algunos medicamentos para la presin arterial.  Comer o beber ciertos productos.  Hacer actividad fsica. Otros factores que pueden provocar cefalea migraosa son los siguientes:  Tener el perodo menstrual.  Glennis Brink.  Hambre.  Estrs.  No dormir lo suficiente o  dormir demasiado.  Cambios climticos.  Cansancio (fatiga). Qu incrementa el riesgo?  Tener entre 25 y 16aos de edad.  Ser mujer.  Tener antecedentes familiares de cefalea migraosa.  Ser de Science writer.  Tener depresin o ansiedad.  Tener mucho sobrepeso. Cules son los signos o los sntomas?  Un dolor punzante. Este dolor puede [employer] siguientes caractersticas: ? Audiological scientist en cualquier regin de la cabeza, tanto de un lado como de [place]. ? Puede dificultar las actividades cotidianas. ? Puede empeorar con la actividad fsica. ? Puede empeorar con las luces brillantes o los ruidos fuertes.  Otros sntomas pueden incluir: ? Ganas de vomitar (nuseas). ? Vmitos. ? Mareos. ? Sensibilidad a las luces brillantes, los ruidos fuertes o [employer].  Antes de tener una cefalea migraosa, puede recibir seales de advertencia (aura). Un aura puede incluir: ? Ver luces intermitentes o tener puntos ciegos. ? Ver puntos brillantes, halos o lneas en zigzag. ? Tener una visin en tnel o visin borrosa. ? Sentir entumecimiento u hormigueo. ? Tener dificultad para hablar. ? Tener msculos dbiles.  Algunas personas tienen sntomas despus de una cefalea migraosa (fase posdromal), como los siguientes: ? Cansancio. ? Dificultad para pensar (concentrarse). Cmo se trata?  Tomar medicamentos para: ? Best boy. ? Aliviar la sensacin de Higher education careers adviser. ? Prevenir las Psychologist, occupational.  El tratamiento tambin puede incluir lo siguiente: ? Tomar sesiones de acupuntura. ? Evitar los alimentos que provocan las cefaleas migraosas. ? Aprender Orland Jarred de controlar las funciones corporales (biorretroalimentacin). ? Terapia para ayudarlo a Civil engineer, contracting y lidiar con los pensamientos  negativos (terapia cognitivo conductual). Siga estas instrucciones en su casa: Medicamentos  Tome los medicamentos de venta libre y los recetados solamente como se lo haya  indicado el mdico.  Consulte a su mdico si el medicamento que le recetaron: ? Hace que sea necesario que evite conducir o usar maquinaria pesada. ? Puede causarle dificultad para defecar (estreimiento). Es posible que deba tomar estas medidas para prevenir o tratar los problemas para defecar:  Beber suficiente lquido para mantener el pis (la orina) de color amarillo plido.  Tomar medicamentos recetados o de venta libre.  Comer alimentos ricos en fibra. Entre ellos, frijoles, cereales integrales y frutas y verduras frescas.  Limitar los alimentos con alto contenido de grasa y azcar. Estos incluyen alimentos fritos o dulces. Estilo de vida  No beba alcohol.  No consuma ningn producto que contenga nicotina o tabaco, como cigarrillos, cigarrillos electrnicos y tabaco de mascar. Si necesita ayuda para dejar de fumar, consulte al mdico.  Duerma como mnimo 8horas todas las noches.  Limite el estrs y [place]jelo. Indicaciones generales      Lleve un registro diario para averiguar lo que puede provocar las cefaleas migraosas. Registre, por ejemplo, lo siguiente: ? Lo que usted come y bebe. ? El tiempo que duerme. ? Algn cambio en lo que come o bebe. ? Algn cambio en sus medicamentos.  Si tiene una cefalea migraosa: ? Evite los factores que empeoren los sntomas, como las luces brillantes. ? Resulta til acostarse en una habitacin oscura y silenciosa. ? No conduzca vehculos ni opere maquinaria pesada. ? Pregntele al mdico qu actividades son seguras para usted.  Concurra a todas las visitas de seguimiento como se lo haya indicado el mdico. Esto es importante. Comunquese con un mdico si:  Tiene una cefalea migraosa que es diferente o peor que otras que ha tenido.  Tiene ms de 15 das de cefalea por mes. Solicite ayuda inmediatamente si:  La cefalea migraosa empeora mucho.  La cefalea migraosa dura ms de 72 horas.  Tiene fiebre.  Presenta rigidez en  el cuello.  Tiene dificultad para ver.  Siente debilidad en los msculos o que no puede controlarlos.  Comienza a perder el equilibrio continuamente.  Comienza a tener dificultad para caminar.  Pierde el conocimiento (se desmaya).  Tiene una convulsin. Resumen  Una cefalea migraosa es un dolor muy intenso y punzante en uno o ambos lados de la cabeza. Estos dolores de cabeza tambin pueden causar otros sntomas.  Esta afeccin puede tratarse con medicamentos y cambios en el estilo de vida.  Lleve un registro diario para averiguar lo que puede provocar las cefaleas migraosas.  Comunquese con un mdico si tiene una cefalea migraosa que es diferente o peor que otras que ha tenido.  Comunquese con el mdico si tiene ms de 15 das de cefalea en un mes. Esta informacin no tiene como fin reemplazar el consejo del mdico. Asegrese de hacerle al mdico cualquier pregunta que tenga. Document Revised: 08/19/2018 Document Reviewed: 08/19/2018 Elsevier Patient Education  2020 Elsevier Inc.  

## 2019-07-12 ENCOUNTER — Encounter: Payer: Self-pay | Admitting: Radiology

## 2019-07-12 LAB — SAR COV2 SEROLOGY (COVID19)AB(IGG),IA: DiaSorin SARS-CoV-2 Ab, IgG: NEGATIVE

## 2019-10-03 ENCOUNTER — Ambulatory Visit (INDEPENDENT_AMBULATORY_CARE_PROVIDER_SITE_OTHER): Payer: Self-pay | Admitting: Registered Nurse

## 2019-10-03 ENCOUNTER — Other Ambulatory Visit: Payer: Self-pay

## 2019-10-03 ENCOUNTER — Encounter: Payer: Self-pay | Admitting: Registered Nurse

## 2019-10-03 VITALS — BP 108/74 | HR 71 | Temp 97.9°F | Ht 63.5 in | Wt 124.0 lb

## 2019-10-03 DIAGNOSIS — Z789 Other specified health status: Secondary | ICD-10-CM

## 2019-10-03 DIAGNOSIS — G43109 Migraine with aura, not intractable, without status migrainosus: Secondary | ICD-10-CM

## 2019-10-03 MED ORDER — SUMATRIPTAN SUCCINATE 50 MG PO TABS: 50.0000 mg | ORAL_TABLET | ORAL | 4 refills | Status: AC | PRN

## 2019-10-03 NOTE — Progress Notes (Signed)
Acute Office Visit  Subjective:    Patient ID: Sabrina Sims, female    DOB: 1990-10-18, 29 y.o.   MRN: [id number]  Chief Complaint  Patient presents with  . Mass    bump discovered on the left shoulder. Says the bump appeared 8 days ago agter 2nd covid vaccine. Does have some pain.     HPI Patient is in today for swelling on L clavicle.  Started in the day or two after getting her second covid shot. Received this shot 8 days ago. Has steadily improved since, but concerned that there is something deeper going on.   Noted after shot felt a little feverish and sore for 1-2 days, resolved quickly.  No breast abnormalities noted.  History reviewed. No pertinent past medical history.  History reviewed. No pertinent surgical history.  Family History  Problem Relation Age of Onset  . Healthy Mother   . Healthy Father   . Healthy Sister     Social History   Socioeconomic History  . Marital status: Single    Spouse name: Not on file  . Number of children: Not on file  . Years of education: Not on file  . Highest education level: Not on file  Occupational History  . Not on file  Tobacco Use  . Smoking status: Never Smoker  . Smokeless tobacco: Never Used  Substance and Sexual Activity  . Alcohol use: No  . Drug use: No  . Sexual activity: Yes  Other Topics Concern  . Not on file  Social History Narrative  . Not on file   Social Determinants of Health   Financial Resource Strain:   . Difficulty of Paying Living Expenses:   Food Insecurity:   . Worried About Charity fundraiser in the Last Year:   . Arboriculturist in the Last Year:   Transportation Needs:   . Film/video editor (Medical):   Marland Kitchen Lack of Transportation (Non-Medical):   Physical Activity:   . Days of Exercise per Week:   . Minutes of Exercise per Session:   Stress:   . Feeling of Stress :   Social Connections:   . Frequency of Communication with Friends and Family:   . Frequency of  Social Gatherings with Friends and Family:   . Attends Religious Services:   . Active Member of Clubs or Organizations:   . Attends Archivist Meetings:   Marland Kitchen Marital Status:   Intimate Partner Violence:   . Fear of Current or Ex-Partner:   . Emotionally Abused:   Marland Kitchen Physically Abused:   . Sexually Abused:     Outpatient Medications Prior to Visit  Medication Sig Dispense Refill  . fluticasone (FLONASE) 50 MCG/ACT nasal spray Place 1 spray into both nostrils 2 (two) times daily. (Patient not taking: Reported on 07/11/2019) 16 g 6  . SUMAtriptan (IMITREX) 50 MG tablet Take 50 mg at the onset of headache; may repeat every 2 hours but no more than 200 mg/24 hours. 10 tablet 3   No facility-administered medications prior to visit.    Not on File  Review of Systems  Constitutional: Negative.   HENT: Negative.   Eyes: Negative.   Respiratory: Negative.   Cardiovascular: Negative.   Gastrointestinal: Negative.   Endocrine: Negative.   Genitourinary: Negative.   Musculoskeletal: Negative.   Skin: Negative.   Allergic/Immunologic: Negative.   Neurological: Negative.   Hematological: Negative.   Psychiatric/Behavioral: Negative.   All other systems reviewed  and are negative.      Objective:    Physical Exam Vitals and nursing note reviewed.  Constitutional:      General: She is not in acute distress.    Appearance: Normal appearance. She is normal weight. She is not ill-appearing, toxic-appearing or diaphoretic.     Comments: Supraclavicular lymphadenopathy suspected with mild tenderness and point tenderness  Cardiovascular:     Rate and Rhythm: Normal rate and regular rhythm.  Skin:    General: Skin is warm and dry.     Capillary Refill: Capillary refill takes less than 2 seconds.     Coloration: Skin is not jaundiced or pale.     Findings: No bruising, erythema, lesion or rash.  Neurological:     General: No focal deficit present.     Mental Status: She is  alert and oriented to person, place, and time. Mental status is at baseline.  Psychiatric:        Mood and Affect: Mood normal.        Behavior: Behavior normal.        Thought Content: Thought content normal.        Judgment: Judgment normal.     BP 108/74   Pulse 71   Temp 97.9 F (36.6 C)   Ht 5' 3.5" (1.613 m)   Wt 124 lb (56.2 kg)   LMP 09/21/2019   SpO2 98%   BMI 21.62 kg/m  Wt Readings from Last 3 Encounters:  10/03/19 124 lb (56.2 kg)  07/11/19 123 lb (55.8 kg)  01/25/18 122 lb 9.6 oz (55.6 kg)    Health Maintenance Due  Topic Date Due  . HIV Screening  Never done  . PAP SMEAR-Modifier  Never done    There are no preventive care reminders to display for this patient.   No results found for: TSH Lab Results  Component Value Date   WBC 5.3 01/25/2018   HGB 13.6 01/25/2018   HCT 40.5 01/25/2018   MCV 92 01/25/2018   PLT 261 01/25/2018   Lab Results  Component Value Date   NA 138 01/25/2018   K 5.1 01/25/2018   CO2 26 01/25/2018   GLUCOSE 91 01/25/2018   BUN 8 01/25/2018   CREATININE 0.58 01/25/2018   BILITOT 0.3 01/25/2018   ALKPHOS 63 01/25/2018   AST 22 01/25/2018   ALT 16 01/25/2018   PROT 7.7 01/25/2018   ALBUMIN 4.8 01/25/2018   CALCIUM 9.6 01/25/2018   No results found for: CHOL No results found for: HDL No results found for: LDLCALC No results found for: TRIG No results found for: CHOLHDL No results found for: ENMM7W     Assessment & Plan:   Problem List Items Addressed This Visit    None    Visit Diagnoses    Has immunity to COVID-19 virus    -  Primary   Migraine with aura and without status migrainosus, not intractable       Relevant Medications   SUMAtriptan (IMITREX) 50 MG tablet       Meds ordered this encounter  Medications  . SUMAtriptan (IMITREX) 50 MG tablet    Sig: Take 1 tablet (50 mg total) by mouth every 2 (two) hours as needed for migraine. May repeat in 2 hours if headache persists or recurs.    Dispense:   10 tablet    Refill:  4    Order Specific Question:   Supervising Provider    Answer:   Creta Levin, ZOE  A [[id number]]   PLAN  Suspect local reaction with supraclavicular lymphadenopathy. Given steady improvement, suggest patient monitors and returns if any new or different symptoms arise.  Needs refills on sumatriptan for migraines - no complaints with this medication, it works well, uses relatively infrequently. No new or red flag symptoms on headaches.  Patient encouraged to call clinic with any questions, comments, or concerns.   [name], NP

## 2019-10-03 NOTE — Patient Instructions (Signed)
° ° ° °  If you have lab work done today you will be contacted with your lab results within the next 2 weeks.  If you have not heard from us then please contact us. The fastest way to get your results is to register for My Chart. ° ° °IF you received an x-ray today, you will receive an invoice from [facility]. Please contact  Radiology at [phone] with questions or concerns regarding your invoice.  ° °IF you received labwork today, you will receive an invoice from [facility]. Please contact [facility] at [phone] with questions or concerns regarding your invoice.  ° °Our billing staff will not be able to assist you with questions regarding bills from these companies. ° °You will be contacted with the lab results as soon as they are available. The fastest way to get your results is to activate your My Chart account. Instructions are located on the last page of this paperwork. If you have not heard from us regarding the results in 2 weeks, please contact this office. °  ° ° ° °

## 2022-12-30 ENCOUNTER — Encounter: Payer: Self-pay | Admitting: Emergency Medicine

## 2022-12-30 ENCOUNTER — Ambulatory Visit (INDEPENDENT_AMBULATORY_CARE_PROVIDER_SITE_OTHER): Payer: Self-pay | Admitting: Emergency Medicine

## 2022-12-30 VITALS — BP 118/74 | HR 62 | Temp 99.0°F | Ht 63.5 in | Wt 133.5 lb

## 2022-12-30 DIAGNOSIS — Z7689 Persons encountering health services in other specified circumstances: Secondary | ICD-10-CM

## 2022-12-30 DIAGNOSIS — R1010 Upper abdominal pain, unspecified: Secondary | ICD-10-CM

## 2022-12-30 LAB — CBC WITH DIFFERENTIAL/PLATELET
Basophils Absolute: 0 10*3/uL (ref 0.0–0.1)
Basophils Relative: 0.4 % (ref 0.0–3.0)
Eosinophils Absolute: 0.2 10*3/uL (ref 0.0–0.7)
Eosinophils Relative: 2.6 % (ref 0.0–5.0)
HCT: 38.1 % (ref 36.0–46.0)
Hemoglobin: 12.9 g/dL (ref 12.0–15.0)
Lymphocytes Relative: 36.7 % (ref 12.0–46.0)
Lymphs Abs: 2.3 10*3/uL (ref 0.7–4.0)
MCHC: 33.7 g/dL (ref 30.0–36.0)
MCV: 90.5 fl (ref 78.0–100.0)
Monocytes Absolute: 0.5 10*3/uL (ref 0.1–1.0)
Monocytes Relative: 8.5 % (ref 3.0–12.0)
Neutro Abs: 3.2 10*3/uL (ref 1.4–7.7)
Neutrophils Relative %: 51.8 % (ref 43.0–77.0)
Platelets: 280 10*3/uL (ref 150.0–400.0)
RBC: 4.21 Mil/uL (ref 3.87–5.11)
RDW: 12.9 % (ref 11.5–15.5)
WBC: 6.1 10*3/uL (ref 4.0–10.5)

## 2022-12-30 LAB — COMPREHENSIVE METABOLIC PANEL
ALT: 20 U/L (ref 0–35)
AST: 18 U/L (ref 0–37)
Albumin: 4.3 g/dL (ref 3.5–5.2)
Alkaline Phosphatase: 54 U/L (ref 39–117)
BUN: 10 mg/dL (ref 6–23)
CO2: 28 mEq/L (ref 19–32)
Calcium: 9.7 mg/dL (ref 8.4–10.5)
Chloride: 101 mEq/L (ref 96–112)
Creatinine, Ser: 0.59 mg/dL (ref 0.40–1.20)
GFR: 117.56 mL/min (ref >60.00)
Glucose, Bld: 89 mg/dL (ref 70–99)
Potassium: 4.2 mEq/L (ref 3.5–5.1)
Sodium: 136 mEq/L (ref 135–145)
Total Bilirubin: 0.3 mg/dL (ref 0.2–1.2)
Total Protein: 7.5 g/dL (ref 6.0–8.3)

## 2022-12-30 LAB — LIPASE: Lipase: 43 U/L (ref 11.0–59.0)

## 2022-12-30 LAB — LIPID PANEL
Cholesterol: 204 mg/dL — ABNORMAL HIGH (ref 0–200)
HDL: 55.8 mg/dL (ref >39.00)
LDL Cholesterol: 115 mg/dL — ABNORMAL HIGH (ref 0–99)
NonHDL: 147.8
Total CHOL/HDL Ratio: 4
Triglycerides: 164 mg/dL — ABNORMAL HIGH (ref 0.0–149.0)
VLDL: 32.8 mg/dL (ref 0.0–40.0)

## 2022-12-30 LAB — HEMOGLOBIN A1C: Hgb A1c MFr Bld: 5.6 % (ref 4.6–6.5)

## 2022-12-30 NOTE — Progress Notes (Signed)
Sabrina Sims 32 y.o.   Chief Complaint  Patient presents with   New Patient (Initial Visit)    Patient states she his having some upper abd pain, radiates around to her back, after she eats certain foods she feels the pain.  she had an u/s done     HISTORY OF PRESENT ILLNESS: This is a 32 y.o. female first visit to this office, here to establish care with me. Mostly complaining of upper abdominal discomfort triggered by certain foods mostly fried foods Went to urgent care clinic last April.  Normal CMP and CBC.  Abdominal ultrasound showed small cyst to liver.  No gallstones. Normal kidneys.  Normal aorta.  Normal pancreas. No other complaints or medical concerns today  HPI   Prior to Admission medications   Medication Sig Start Date End Date Taking? Authorizing Provider  SUMAtriptan (IMITREX) 50 MG tablet Take 1 tablet (50 mg total) by mouth every 2 (two) hours as needed for migraine. May repeat in 2 hours if headache persists or recurs. Patient not taking: Reported on 12/30/2022 10/03/19   [name], NP    Not on File  There are no problems to display for this patient.   No past medical history on file.  No past surgical history on file.  Social History   Socioeconomic History   Marital status: Single    Spouse name: Not on file   Number of children: Not on file   Years of education: Not on file   Highest education level: Not on file  Occupational History   Not on file  Tobacco Use   Smoking status: Never   Smokeless tobacco: Never  Substance and Sexual Activity   Alcohol use: No   Drug use: No   Sexual activity: Yes  Other Topics Concern   Not on file  Social History Narrative   Not on file   Social Determinants of Health   Financial Resource Strain: Not on file  Food Insecurity: Not on file  Transportation Needs: Not on file  Physical Activity: Not on file  Stress: Not on file  Social Connections: Not on file  Intimate Partner  Violence: Not on file    Family History  Problem Relation Age of Onset   Healthy Mother    Healthy Father    Healthy Sister      Review of Systems  Constitutional: Negative.  Negative for chills and fever.  HENT: Negative.  Negative for congestion and sore throat.   Respiratory: Negative.  Negative for cough and shortness of breath.   Cardiovascular: Negative.  Negative for chest pain and palpitations.  Gastrointestinal:  Positive for abdominal pain. Negative for blood in stool, constipation, diarrhea, melena, nausea and vomiting.  Genitourinary: Negative.  Negative for dysuria and hematuria.  Skin: Negative.  Negative for rash.  Neurological: Negative.  Negative for dizziness and headaches.  All other systems reviewed and are negative.   Vitals:   12/30/22 1401  BP: 118/74  Pulse: 62  Temp: 99 F (37.2 C)  SpO2: 98%    Physical Exam Vitals reviewed.  Constitutional:      Appearance: Normal appearance.  HENT:     Head: Normocephalic.     Mouth/Throat:     Mouth: Mucous membranes are moist.     Pharynx: Oropharynx is clear.  Eyes:     Extraocular Movements: Extraocular movements intact.     Conjunctiva/sclera: Conjunctivae normal.     Pupils: Pupils are equal, round, and reactive to  light.  Cardiovascular:     Rate and Rhythm: Normal rate and regular rhythm.     Pulses: Normal pulses.     Heart sounds: Normal heart sounds.  Pulmonary:     Effort: Pulmonary effort is normal.     Breath sounds: Normal breath sounds.  Abdominal:     Palpations: Abdomen is soft.     Tenderness: There is no abdominal tenderness.  Musculoskeletal:     Cervical back: No tenderness.  Lymphadenopathy:     Cervical: No cervical adenopathy.  Skin:    General: Skin is warm and dry.     Capillary Refill: Capillary refill takes less than 2 seconds.  Neurological:     General: No focal deficit present.     Mental Status: She is alert and oriented to person, place, and time.   Psychiatric:        Mood and Affect: Mood normal.        Behavior: Behavior normal.      ASSESSMENT & PLAN: Problem List Items Addressed This Visit       Other   Upper abdominal pain - Primary    Clinically stable.  No red flag signs or symptoms. Benign examination. Differential diagnosis discussed. Recent abdominal ultrasound report reviewed with patient CMP and CBC done last April within normal limits Most likely developing nonspecific food intolerance. May need GI evaluation Blood work done today.      Relevant Orders   Lipase   Hemoglobin A1c   Comprehensive metabolic panel   CBC with Differential/Platelet   Lipid panel   Other Visit Diagnoses     Encounter to establish care          Patient Instructions  Mantenimiento de la salud en las mujeres Health Maintenance, Female Adoptar un estilo de vida saludable y recibir atencin preventiva son importantes para promover la salud y Counsellor. Consulte al mdico sobre: El esquema adecuado para hacerse pruebas y exmenes peridicos. Cosas que puede hacer por su cuenta para prevenir enfermedades y [place] sano. Qu debo saber sobre la dieta, el peso y el ejercicio? Consuma una dieta saludable  Consuma una dieta que incluya muchas verduras, frutas, productos lcteos con bajo contenido de [place] y Associate Professor. No consuma muchos alimentos ricos en grasas slidas, azcares agregados o sodio. Mantenga un peso saludable El ndice de masa muscular [facility]) se [place] para identificar problemas de [place]. Proporciona una estimacin de la grasa corporal basndose en el peso y la altura. Su mdico puede ayudarle a Engineer, site IMC y a Personnel officer o Pharmacologist un peso saludable. Haga ejercicio con regularidad Haga ejercicio con regularidad. Esta es una de las prcticas ms importantes que puede hacer por su salud. La [employer] de los adultos deben seguir estas pautas: Education officer, environmental, al menos, 150 minutos de actividad fsica por semana.  El ejercicio debe aumentar la frecuencia cardaca y Media planner transpirar (ejercicio de intensidad moderada). Hacer ejercicios de fortalecimiento por lo [employer] por semana. Agregue esto a su plan de ejercicio de intensidad moderada. Pase menos tiempo sentada. Incluso la actividad fsica ligera puede ser beneficiosa. Controle sus niveles de colesterol y lpidos en la sangre Comience a realizarse anlisis de lpidos y Oncologist en la sangre a los 20 aos y luego reptalos cada 5 aos. Hgase controlar los niveles de colesterol con mayor frecuencia si: Sus niveles de lpidos y colesterol son altos. Es mayor de 40 aos. Presenta un alto riesgo de padecer enfermedades cardacas. Qu debo saber [employer]  de deteccin del cncer? Segn su historia clnica y sus antecedentes familiares, es posible que deba realizarse pruebas de deteccin del cncer en diferentes edades. Esto puede incluir pruebas de deteccin de lo siguiente: Cncer de mama. Cncer de cuello uterino. Cncer colorrectal. Cncer de piel. Cncer de pulmn. Qu debo saber sobre la enfermedad cardaca, la diabetes y la hipertensin arterial? Presin arterial y enfermedad cardaca La hipertensin arterial causa enfermedades cardacas y [place] el riesgo de accidente cerebrovascular. Es ms probable que esto se manifieste en las personas que tienen lecturas de presin arterial alta o tienen sobrepeso. Hgase controlar la presin arterial: Cada 3 a 5 aos si tiene entre 18 y 60 aos. Todos los aos si es mayor de 40 aos. Diabetes Realcese exmenes de deteccin de la diabetes con regularidad. Este anlisis revisa el nivel de azcar en la sangre en [place]. Hgase las pruebas de deteccin: Cada tres aos despus de los 40 aos de edad si tiene un peso normal y un bajo riesgo de padecer diabetes. Con ms frecuencia y a partir de [place] edad inferior si tiene sobrepeso o un alto riesgo de padecer diabetes. Qu debo saber sobre  la prevencin de infecciones? Hepatitis B Si tiene un riesgo ms alto de contraer hepatitis B, debe someterse a un examen de deteccin de este virus. Hable con el mdico para averiguar si tiene riesgo de contraer la infeccin por hepatitis B. Hepatitis C Se recomienda el anlisis a: [employer] 1945 y 1965. Todas las personas que tengan un riesgo de haber contrado hepatitis C. Enfermedades de transmisin sexual (ETS) Hgase las pruebas de Airline pilot de ITS, incluidas la gonorrea y la clamidia, si: Es sexualmente activa y es menor de [address]. Es mayor de [address], y Public affairs consultant informa que corre riesgo de tener este tipo de infecciones. La actividad sexual ha cambiado desde que le hicieron la ltima prueba de deteccin y tiene un riesgo mayor de Warehouse manager clamidia o Copy. Pregntele al mdico si usted tiene riesgo. Pregntele al mdico si usted tiene un alto riesgo de Primary school teacher VIH. El mdico tambin puede recomendarle un medicamento recetado para ayudar a evitar la infeccin por el VIH. Si elige tomar medicamentos para prevenir el VIH, primero debe [employer] de deteccin del VIH. Luego debe hacerse anlisis cada 3 meses mientras est tomando los medicamentos. Embarazo Si est por dejar de Armed forces training and education officer (fase premenopusica) y usted puede quedar [place], busque asesoramiento antes de [place]. [place] de 400 a 800 microgramos (mcg) de cido [employer] si [place]. Pida mtodos de control de la natalidad (anticonceptivos) si desea evitar un embarazo no deseado. Osteoporosis y [place] La osteoporosis es una enfermedad en la que los huesos pierden los minerales y la fuerza por el avance de la edad. El resultado pueden ser fracturas en los [place]. Si tiene 65 aos o ms, o si est en riesgo de sufrir osteoporosis y fracturas, pregunte a su mdico si debe: Hacerse pruebas de deteccin de prdida sea. Tomar un suplemento de calcio o de vitamina D  para reducir el riesgo de fracturas. Recibir terapia de reemplazo hormonal (TRH) para tratar los sntomas de la menopausia. Siga estas indicaciones en su casa: Consumo de alcohol No beba alcohol si: Su mdico le indica no hacerlo. Est embarazada, puede estar embarazada o est tratando de [place]. Si bebe alcohol: Limite la cantidad que bebe a lo siguiente: De 0 a 1 bebida por da. Sepa cunta cantidad de alcohol  hay en las bebidas que toma. En los [address], una medida equivale a una botella de cerveza de 12 oz (355 ml), un vaso de vino de 5 oz (148 ml) o un vaso de una bebida alcohlica de alta graduacin de 1 oz (44 ml). Estilo de vida No consuma ningn producto que contenga nicotina o tabaco. Estos productos incluyen cigarrillos, tabaco para Theatre manager y aparatos de vapeo, como los Administrator, Civil Service. Si necesita ayuda para dejar de consumir estos productos, consulte al mdico. No consuma drogas. No comparta agujas. Solicite ayuda a su mdico si necesita apoyo o informacin para abandonar las drogas. Indicaciones generales Realcese los estudios de rutina de [address], dentales y de Wellsite geologist. Mantngase al da con las vacunas. Infrmele a su mdico si: Se siente deprimida con frecuencia. Alguna vez ha sido vctima de [place] o no se siente seguro en su casa. Resumen Adoptar un estilo de vida saludable y recibir atencin preventiva son importantes para promover la salud y Counsellor. Siga las instrucciones del mdico acerca de una dieta saludable, el ejercicio y la realizacin de pruebas o exmenes para Hotel manager. Siga las instrucciones del mdico con respecto al control del colesterol y la presin arterial. Esta informacin no tiene Theme park manager el consejo del mdico. Asegrese de hacerle al mdico cualquier pregunta que tenga. Document Revised: 11/14/2020 Document Reviewed: 11/14/2020 Elsevier Patient Education  2024 Elsevier Inc.    Edwina Barth, MD [facility]

## 2022-12-30 NOTE — Assessment & Plan Note (Signed)
Clinically stable.  No red flag signs or symptoms. Benign examination. Differential diagnosis discussed. Recent abdominal ultrasound report reviewed with patient CMP and CBC done last April within normal limits Most likely developing nonspecific food intolerance. May need GI evaluation Blood work done today.

## 2022-12-30 NOTE — Patient Instructions (Signed)

## 2023-01-25 ENCOUNTER — Ambulatory Visit (INDEPENDENT_AMBULATORY_CARE_PROVIDER_SITE_OTHER): Payer: Self-pay | Admitting: Emergency Medicine

## 2023-01-25 VITALS — BP 120/84 | HR 90 | Temp 98.6°F | Ht 63.5 in | Wt 133.2 lb

## 2023-01-25 DIAGNOSIS — L509 Urticaria, unspecified: Secondary | ICD-10-CM

## 2023-01-25 MED ORDER — METHYLPREDNISOLONE ACETATE 40 MG/ML IJ SUSP
40.0000 mg | Freq: Once | INTRAMUSCULAR | Status: AC
Start: 2023-01-25 — End: 2023-01-25
  Administered 2023-01-25: 40 mg via INTRAMUSCULAR

## 2023-01-25 MED ORDER — METHYLPREDNISOLONE 4 MG PO TBPK
ORAL_TABLET | ORAL | 1 refills | Status: AC
Start: 2023-01-25 — End: ?

## 2023-01-25 NOTE — Progress Notes (Signed)
Sabrina Sims 32 y.o.   Chief Complaint  Patient presents with   Rash    Patient has a rash on her legs, started on her back and scalp, red and itchy     HISTORY OF PRESENT ILLNESS: This is a 32 y.o. female complaining of diffuse itchy rash in her body that started last Saturday Took Benadryl with partial relief.  Still symptomatic  Rash Associated symptoms include coughing. Pertinent negatives include no congestion, diarrhea, fever, shortness of breath, sore throat or vomiting.     Prior to Admission medications   Medication Sig Start Date End Date Taking? Authorizing Provider  methylPREDNISolone (MEDROL DOSEPAK) 4 MG TBPK tablet Sig as indicated 01/25/23  Yes Liam Cammarata, Eilleen Kempf, MD  SUMAtriptan (IMITREX) 50 MG tablet Take 1 tablet (50 mg total) by mouth every 2 (two) hours as needed for migraine. May repeat in 2 hours if headache persists or recurs. Patient not taking: Reported on 12/30/2022 10/03/19   [name], NP    Allergies  Allergen Reactions   Naproxen Sodium Hives and Itching    There are no problems to display for this patient.   No past medical history on file.  No past surgical history on file.  Social History   Socioeconomic History   Marital status: Single    Spouse name: Not on file   Number of children: Not on file   Years of education: Not on file   Highest education level: 12th grade  Occupational History   Not on file  Tobacco Use   Smoking status: Never   Smokeless tobacco: Never  Substance and Sexual Activity   Alcohol use: No   Drug use: No   Sexual activity: Yes  Other Topics Concern   Not on file  Social History Narrative   Not on file   Social Determinants of Health   Financial Resource Strain: Low Risk  (01/25/2023)   Overall Financial Resource Strain (CARDIA)    Difficulty of Paying Living Expenses: Not very hard  Food Insecurity: Patient Declined (01/25/2023)   Hunger Vital Sign    Worried About Running Out of  Food in the Last Year: Patient declined    Ran Out of Food in the Last Year: Patient declined  Transportation Needs: No Transportation Needs (01/25/2023)   PRAPARE - Administrator, Civil Service (Medical): No    Lack of Transportation (Non-Medical): No  Physical Activity: Insufficiently Active (01/25/2023)   Exercise Vital Sign    Days of Exercise per Week: 1 day    Minutes of Exercise per Session: 30 min  Stress: No Stress Concern Present (01/25/2023)   [employer] of Occupational Health - Occupational Stress Questionnaire    Feeling of Stress : Not at all  Social Connections: Unknown (01/25/2023)   Social Connection and Isolation Panel [NHANES]    Frequency of Communication with Friends and Family: Once a week    Frequency of Social Gatherings with Friends and Family: Patient declined    Attends Religious Services: 1 to 4 times per year    Active Member of [employer] or Organizations: No    Attends Banker Meetings: Not on file    Marital Status: Living with partner  Intimate Partner Violence: Unknown (10/02/2022)   Received from [employer], [facility]   HITS    Physically Hurt: Not on file    Insult or Talk Down To: Not on file    Threaten Physical Harm: Not on file  Scream or Curse: Not on file    Family History  Problem Relation Age of Onset   Healthy Mother    Healthy Father    Healthy Sister      Review of Systems  Constitutional: Negative.  Negative for chills and fever.  HENT:  Negative for congestion and sore throat.   Respiratory:  Positive for cough. Negative for shortness of breath and wheezing.   Cardiovascular: Negative.  Negative for chest pain and palpitations.  Gastrointestinal:  Negative for abdominal pain, diarrhea, nausea and vomiting.  Genitourinary: Negative.  Negative for dysuria and hematuria.  Skin:  Positive for itching and rash.  Neurological: Negative.  Negative for dizziness and headaches.  All other systems  reviewed and are negative.   Vitals:   01/25/23 1045  BP: 120/84  Pulse: 90  Temp: 98.6 F (37 C)  SpO2: 100%    Physical Exam Vitals reviewed.  Constitutional:      Appearance: Normal appearance.  HENT:     Head: Normocephalic.     Mouth/Throat:     Mouth: Mucous membranes are moist.     Pharynx: Oropharynx is clear.  Eyes:     Extraocular Movements: Extraocular movements intact.     Conjunctiva/sclera: Conjunctivae normal.     Pupils: Pupils are equal, round, and reactive to light.  Cardiovascular:     Rate and Rhythm: Normal rate and regular rhythm.     Pulses: Normal pulses.     Heart sounds: Normal heart sounds.  Pulmonary:     Effort: Pulmonary effort is normal.     Breath sounds: Normal breath sounds.  Musculoskeletal:     Cervical back: No tenderness.  Lymphadenopathy:     Cervical: No cervical adenopathy.  Skin:    General: Skin is warm and dry.     Capillary Refill: Capillary refill takes less than 2 seconds.     Findings: Rash present.     Comments: Diffuse hives  Neurological:     Mental Status: She is alert and oriented to person, place, and time.  Psychiatric:        Mood and Affect: Mood normal.        Behavior: Behavior normal.      ASSESSMENT & PLAN: Problem List Items Addressed This Visit       Musculoskeletal and Integument   Hives - Primary    Clinically stable.  No signs of anaphylaxis. Unknown trigger.  Differential diagnosis discussed. Continue over-the-counter daily antihistamine for 7 days Given 40 mg of Depo-Medrol in the office Recommend to start Medrol Dosepak for the next several days. ED precautions given. Advised to contact the office if no better or worse during the next several days.      Relevant Medications   methylPREDNISolone (MEDROL DOSEPAK) 4 MG TBPK tablet   Patient Instructions  Ronchas Hives Las ronchas son zonas enrojecidas e hinchadas en la piel que ocasionan picazn. Pueden aparecer en cualquier parte  del cuerpo. En general, desaparecen en el transcurso de 24 horas (ronchas agudas). Si tiene ronchas nuevas despus de que las viejas desaparecen y que duran [address] o [place], se denominan ronchas crnicas. No se transmiten de persona a persona (no son contagiosas). Las ronchas pueden ocurrir cuando el cuerpo reacciona a algo a lo que usted es Best boy (alrgeno). A veces se los llama factores desencadenantes. Puede tener ronchas inmediatamente despus de estar cerca de un factor desencadenante u horas ms tarde. Cules son las causas? Alergias a los alimentos. Picaduras  o mordeduras de insectos. Alergias al polen o a las Neurosurgeon. Exponerse a la luz del sol, al calor o al fro. Actividad fsica. Estrs. Otras causas tales como: Virus. Esto incluye el resfro comn. Las infecciones causadas por grmenes (bacterias). Algunos medicamentos. Productos qumicos o ltex. Vacunas contra la Programmer, multimedia. Transfusiones de [place]. En algunos casos, se desconoce la causa. Qu incrementa el riesgo? Ser mujer. Ser alrgico a alimentos tales como: Frutas ctricas. Leche. Huevos. Manes. Frutos secos. Mariscos. Ser alrgico a: Medicamentos. Ltex. Insectos. Animales. Polen. Cules son los signos o sntomas?  Protuberancias o manchas en la piel, de color rojo o blanco y que producen picazn. Estas zonas pueden: Hincharse o agrandarse. Cambiar de forma y [place]. Aparecer solas o conectarse entre s en una gran rea de piel. Causar escozor o doler. Volverse blancas (palidecer) al presionar en el centro. En casos muy graves, las [place], los pies y la cara tambin pueden hincharse. Esto puede ocurrir si las ronchas comienzan en las capas profundas de la piel. Cmo se trata? El tratamiento para las ronchas depende de los sntomas. Es posible que deba hacer lo siguiente: Usar paos fros y hmedos (compresas fras) o tomar duchas con agua fra para Psychologist, sport and exercise. Usar o Engineer, manufacturing systems para lo siguiente: Ayudar a Associate Professor (antihistamnicos). Disminuir la hinchazn (corticoesteroides). Tratar la infeccin (antibiticos). Recibir un medicamento llamado omalizumab en forma de inyeccin. Puede necesitar esto si las ronchas no mejoran con otros tratamientos. En los casos muy graves, es posible que deba usar un dispositivo lleno de medicamento que administra una inyeccin de emergencia de epinefrina (lpiz autoinyector) para Comptroller reaccin alrgica muy grave (reaccin anafilctica). Siga estas indicaciones en su casa: Medicamentos Use o aplique los medicamentos de venta libre y los recetados solamente como se lo haya indicado el mdico. Si le recetaron antibiticos, tmelos como se lo haya indicado el mdico. No deje de tomarlos aunque comience a sentirse mejor. Cuidado de la piel Aplique paos fros y hmedos en las ronchas. No se rasque la piel. No se frote la piel. Indicaciones generales No se duche ni tome baos de inmersin con agua caliente. Podra empeorar la picazn. No use ropa ajustada. Use pantalla solar. Use ropas que le cubran la piel cuando est al [place]. Evite los factores desencadenantes que le causan las ronchas. Lleve un registro para Presenter, broadcasting de aquello que le causa ronchas. [place] los siguientes datos: Los medicamentos que toma. Lo que usted come y bebe. Lo que se pone en la piel. Concurra a todas las visitas de seguimiento. El mdico deber asegurarse de que el tratamiento est funcionando. Comunquese con un mdico si: Los sntomas no mejoran con los medicamentos. Las articulaciones le duelen o se hinchan. Tiene fiebre. Siente dolor en el vientre (abdomen). Solicite ayuda de inmediato si: La lengua o los labios se le hinchan. Tiene los prpados hinchados. Siente el pecho o la garganta cerrados. Tiene problemas para respirar o tragar. Estos sntomas pueden Customer service manager. Solicite ayuda de  inmediato. Llame al 911. No espere a ver si los sntomas desaparecen. No conduzca por sus propios medios [employer]. Esta informacin no tiene Theme park manager el consejo del mdico. Asegrese de hacerle al mdico cualquier pregunta que tenga. Document Revised: 04/08/2022 Document Reviewed: 04/08/2022 Elsevier Patient Education  2024 Elsevier Inc.      Edwina Barth, MD [facility]

## 2023-01-25 NOTE — Patient Instructions (Signed)
Ronchas Hives Las ronchas son zonas enrojecidas e hinchadas en la piel que ocasionan picazn. Pueden aparecer en cualquier parte del cuerpo. En general, desaparecen en el transcurso de 24 horas (ronchas agudas). Si tiene ronchas nuevas despus de que las viejas desaparecen y que duran [address] o [place], se denominan ronchas crnicas. No se transmiten de persona a persona (no son contagiosas). Las ronchas pueden ocurrir cuando el cuerpo reacciona a algo a lo que usted es Best boy (alrgeno). A veces se los llama factores desencadenantes. Puede tener ronchas inmediatamente despus de estar cerca de un factor desencadenante u horas ms tarde. Cules son las causas? Alergias a los alimentos. Picaduras o mordeduras de insectos. Alergias al polen o a las Neurosurgeon. Exponerse a la luz del sol, al calor o al fro. Actividad fsica. Estrs. Otras causas tales como: Virus. Esto incluye el resfro comn. Las infecciones causadas por grmenes (bacterias). Algunos medicamentos. Productos qumicos o ltex. Vacunas contra la Programmer, multimedia. Transfusiones de [place]. En algunos casos, se desconoce la causa. Qu incrementa el riesgo? Ser mujer. Ser alrgico a alimentos tales como: Frutas ctricas. Leche. Huevos. Manes. Frutos secos. Mariscos. Ser alrgico a: Medicamentos. Ltex. Insectos. Animales. Polen. Cules son los signos o sntomas?  Protuberancias o manchas en la piel, de color rojo o blanco y que producen picazn. Estas zonas pueden: Hincharse o agrandarse. Cambiar de forma y [place]. Aparecer solas o conectarse entre s en una gran rea de piel. Causar escozor o doler. Volverse blancas (palidecer) al presionar en el centro. En casos muy graves, las [place], los pies y la cara tambin pueden hincharse. Esto puede ocurrir si las ronchas comienzan en las capas profundas de la piel. Cmo se trata? El tratamiento para las ronchas depende de los sntomas. Es posible que deba hacer lo  siguiente: Usar paos fros y hmedos (compresas fras) o tomar duchas con agua fra para Psychologist, sport and exercise. Usar o Customer service manager para lo siguiente: Ayudar a Associate Professor (antihistamnicos). Disminuir la hinchazn (corticoesteroides). Tratar la infeccin (antibiticos). Recibir un medicamento llamado omalizumab en forma de inyeccin. Puede necesitar esto si las ronchas no mejoran con otros tratamientos. En los casos muy graves, es posible que deba usar un dispositivo lleno de medicamento que administra una inyeccin de emergencia de epinefrina (lpiz autoinyector) para Comptroller reaccin alrgica muy grave (reaccin anafilctica). Siga estas indicaciones en su casa: Medicamentos Use o aplique los medicamentos de venta libre y los recetados solamente como se lo haya indicado el mdico. Si le recetaron antibiticos, tmelos como se lo haya indicado el mdico. No deje de tomarlos aunque comience a sentirse mejor. Cuidado de la piel Aplique paos fros y hmedos en las ronchas. No se rasque la piel. No se frote la piel. Indicaciones generales No se duche ni tome baos de inmersin con agua caliente. Podra empeorar la picazn. No use ropa ajustada. Use pantalla solar. Use ropas que le cubran la piel cuando est al [place]. Evite los factores desencadenantes que le causan las ronchas. Lleve un registro para Presenter, broadcasting de aquello que le causa ronchas. [place] los siguientes datos: Los medicamentos que toma. Lo que usted come y bebe. Lo que se pone en la piel. Concurra a todas las visitas de seguimiento. El mdico deber asegurarse de que el tratamiento est funcionando. Comunquese con un mdico si: Los sntomas no mejoran con los medicamentos. Las articulaciones le duelen o se hinchan. Tiene fiebre. Siente dolor en el vientre (abdomen). Solicite ayuda de inmediato si: [employer] o los  labios se le hinchan. Tiene los prpados hinchados. Siente el pecho o la  garganta cerrados. Tiene problemas para respirar o tragar. Estos sntomas pueden Customer service manager. Solicite ayuda de inmediato. Llame al 911. No espere a ver si los sntomas desaparecen. No conduzca por sus propios medios [employer]. Esta informacin no tiene Theme park manager el consejo del mdico. Asegrese de hacerle al mdico cualquier pregunta que tenga. Document Revised: 04/08/2022 Document Reviewed: 04/08/2022 Elsevier Patient Education  2024 [employer].

## 2023-01-25 NOTE — Assessment & Plan Note (Addendum)
Clinically stable.  No signs of anaphylaxis. Unknown trigger.  Differential diagnosis discussed. Continue over-the-counter daily antihistamine for 7 days Given 40 mg of Depo-Medrol in the office Recommend to start Medrol Dosepak for the next several days. ED precautions given. Advised to contact the office if no better or worse during the next several days.
